# Patient Record
Sex: Male | Born: 1970 | Race: White | Hispanic: No | Marital: Married | Smoking: Never smoker
Health system: Southern US, Community
[De-identification: ages and names within clinical notes are randomized; demographics above are authoritative.]

---

## 2003-09-23 ENCOUNTER — Ambulatory Visit (HOSPITAL_BASED_OUTPATIENT_CLINIC_OR_DEPARTMENT_OTHER): Admission: RE | Admit: 2003-09-23 | Discharge: 2003-09-23 | Payer: Self-pay | Admitting: Urology

## 2008-05-17 ENCOUNTER — Encounter: Admission: RE | Admit: 2008-05-17 | Discharge: 2008-05-17 | Payer: Self-pay | Admitting: Internal Medicine

## 2010-04-02 ENCOUNTER — Encounter: Payer: Self-pay | Admitting: Sports Medicine

## 2010-07-28 NOTE — Op Note (Signed)
Brandon Mitchell, Brandon Mitchell                             ACCOUNT NO.:  [id number]   MEDICAL RECORD NO.:  [id number]                   PATIENT TYPE:  AMB   LOCATION:  [facility]                                 FACILITY:  [facility]   PHYSICIAN:  Excell Seltzer. Annabell Howells, M.D.                 DATE OF BIRTH:  28-Sep-1970   DATE OF PROCEDURE:  09/23/2003  DATE OF DISCHARGE:                                 OPERATIVE REPORT   PREOPERATIVE DIAGNOSIS:  Desires sterilization.   POSTOPERATIVE DIAGNOSIS:  Desires sterilization.   PROCEDURE PERFORMED:  Bilateral vasectomy.   ATTENDING SURGEON:  Dr. Bjorn Pippin.   RESIDENT SURGEON:  Dr. Thyra Breed   ANESTHESIA:  LMA.   COMPLICATIONS:  None.   INDICATIONS FOR PROCEDURE:  Mr. Doering is a 40 year old white male, who  desires sterilization.  He has 2 children, and his wife has an intolerance  to various birth control methods.  He has a history of a left-sided  orchiopexy.  He desires vasectomy and has been counseled on the risks,  benefits, and alternatives of this procedure.   PROCEDURE IN DETAIL:  Following identification by his arm bracelet, the  patient was brought to the operating room and placed in the supine position.  Here, he underwent successful LMA anesthesia.  His perineum and genitalia  were then prepped with Betadine and draped in the usual sterile fashion.  The left testicle was somewhat high-riding within the left hemi-scrotum  given his history of a left-sided orchiopexy.  We began with a no scalpel  technique near the midline raphe to isolate the left vas deferens.  However,  the vas could not be delivered through this puncture site.  Therefore, we  made a more medial incision lower in the scrotum where the ring clamp was  used to isolate the left vas deferens.  This was then further delivered from  the incision and cleaned using both hemostats and ring clamps and towel  clip.  Once sufficient length of the vas was isolated, a hemostat was placed  on either  end.  Metzenbaum scissors were used to dissect the intervening  segment which was sent for pathologic analysis.  A 2-0 Vicryl suture was  then used to doubly ligate the proximal and distal ends which were then  released.  There was some minimal bleeding of the dartos tissue which was  controlled with Bovie electrocautery.  Through this puncture site, we then  easily found the right-sided vas deferens with the ring clamp and delivered  it from the opening.  An identical procedure was performed to that on the  left, and both the proximal and distal ends of the vas deferens were doubly  tied with 2-0 Vicryl suture.  Following this, the opening through which we  delivered both vas was somewhat large.  Therefore, 2-0 chromic suture was  used to reapproximate the dartos muscle beneath.  This  same suture was then  run in a subcuticular fashion to close the skin.  Collodion was applied to  all puncture sites.  The patient tolerated the procedure well, and there  were no complications.  Please note that Dr. Bjorn Pippin was present and  participated in the entire procedure as he was the responsible surgeon.   DISPOSITION:  After awaking from general anesthetic, the patient was  transferred to the postanesthesia care unit for further postoperative  management prior to discharge to home.     Thyra Breed, MD                            Excell Seltzer. Annabell Howells, M.D.    EG/MEDQ  D:  09/23/2003  T:  09/23/2003  Job:  [id number]

## 2012-05-19 ENCOUNTER — Ambulatory Visit: Payer: Self-pay

## 2012-05-19 ENCOUNTER — Other Ambulatory Visit: Payer: Self-pay | Admitting: Occupational Medicine

## 2012-05-19 DIAGNOSIS — M25532 Pain in left wrist: Secondary | ICD-10-CM

## 2017-03-28 ENCOUNTER — Ambulatory Visit
Admission: RE | Admit: 2017-03-28 | Discharge: 2017-03-28 | Disposition: A | Discharge status: Home / Self Care | Payer: Worker's Compensation | Source: Ambulatory Visit | Attending: Nurse Practitioner | Admitting: Nurse Practitioner

## 2017-03-28 ENCOUNTER — Other Ambulatory Visit: Payer: Self-pay | Admitting: Nurse Practitioner

## 2017-03-28 DIAGNOSIS — M79641 Pain in right hand: Secondary | ICD-10-CM

## 2018-07-27 IMAGING — CR DG HAND COMPLETE 3+V*R*
4 series · 4 of 4 positions shown · non-contrast
Comparison: None

CLINICAL DATA: Heavy drill torqued in right hand yesterday

EXAM:
RIGHT HAND - COMPLETE 3+ VIEW

[x hand pa right]
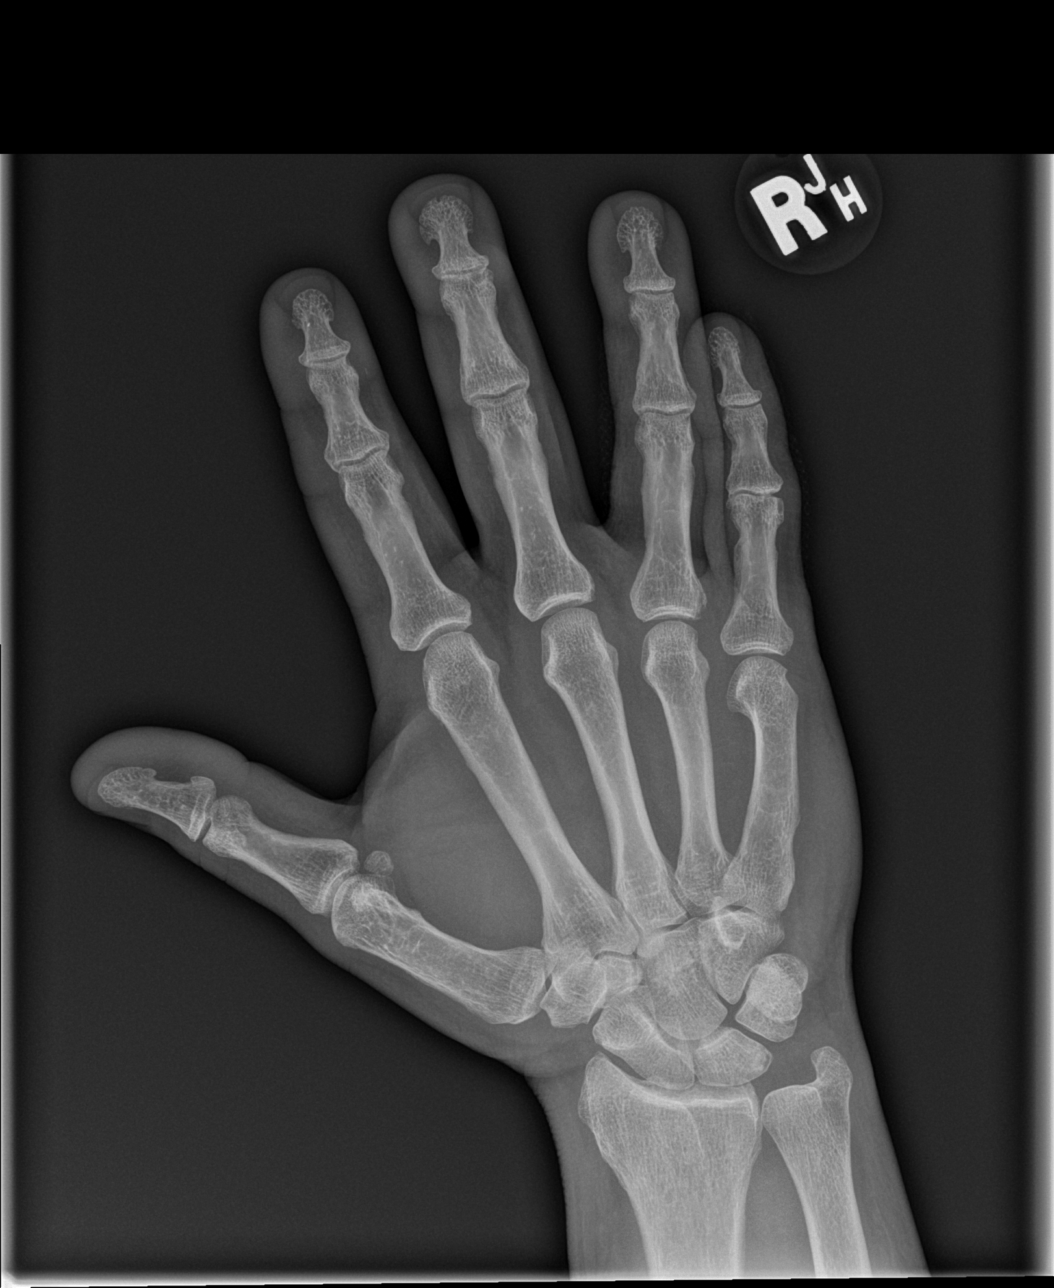

[x hand obl right]
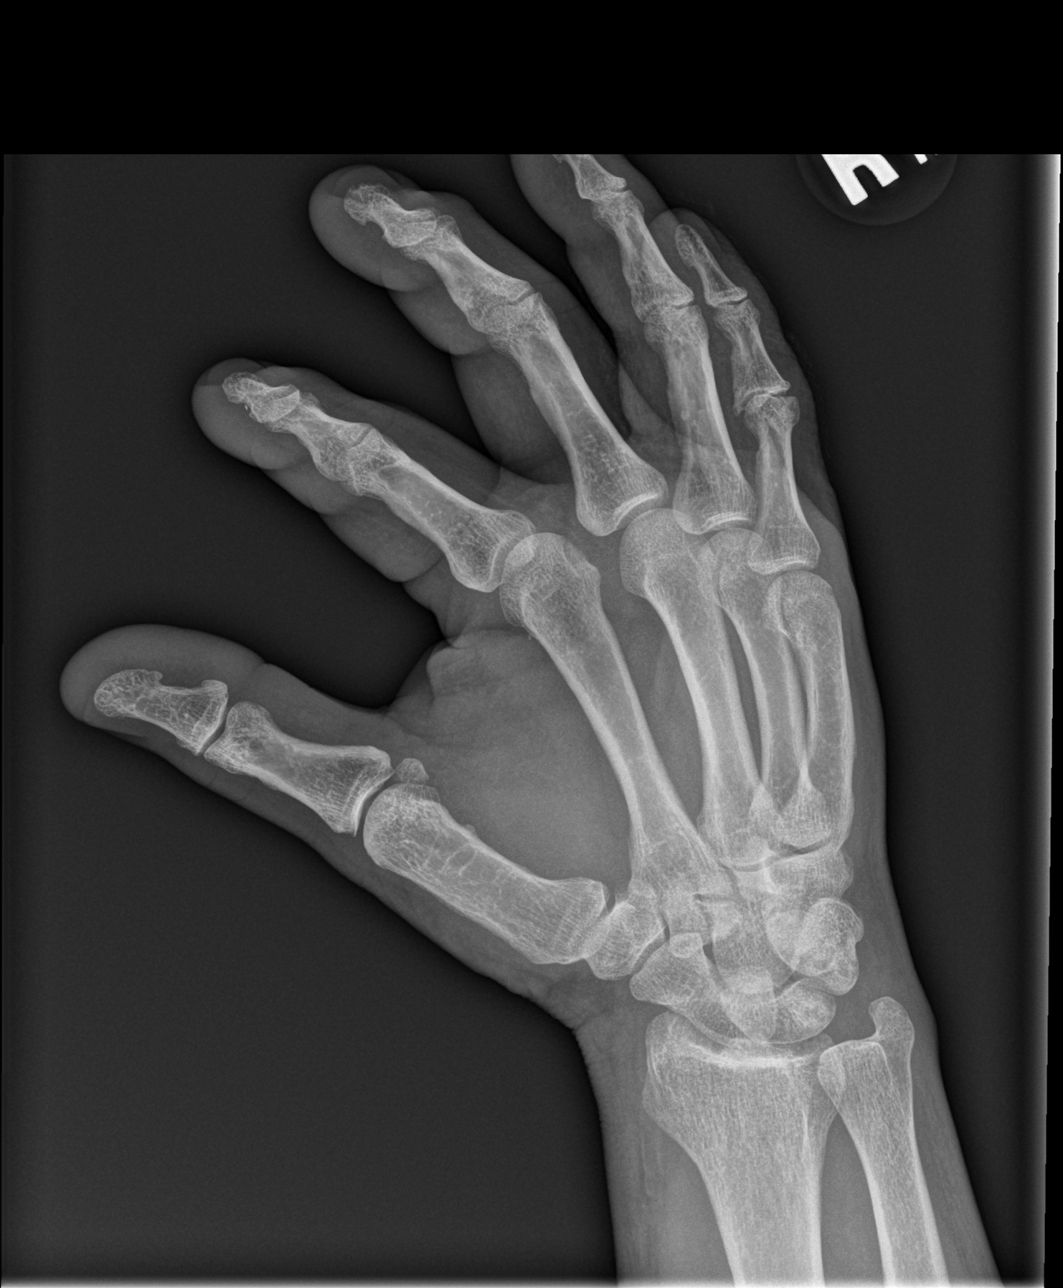

[x hand lat right (1 of 2)]
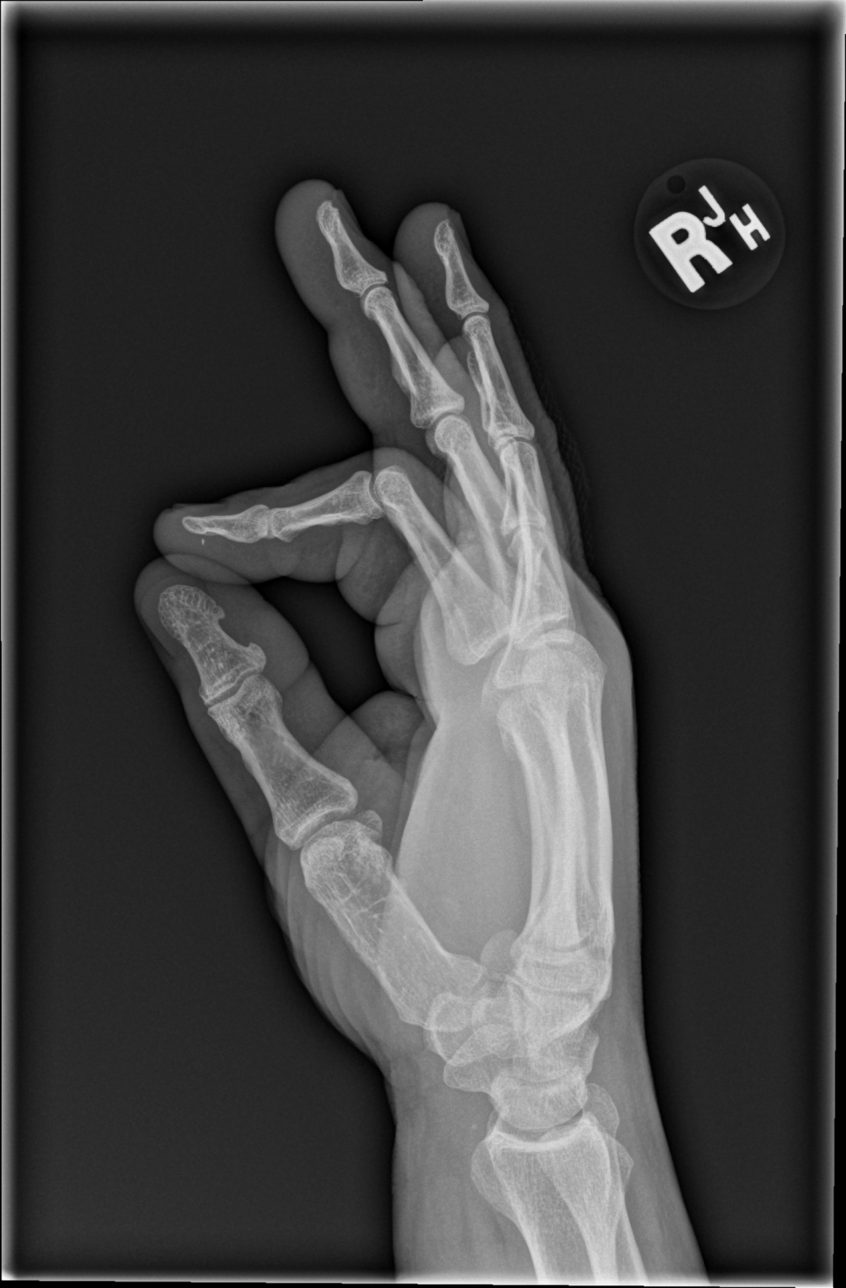

[x hand lat right (2 of 2)]
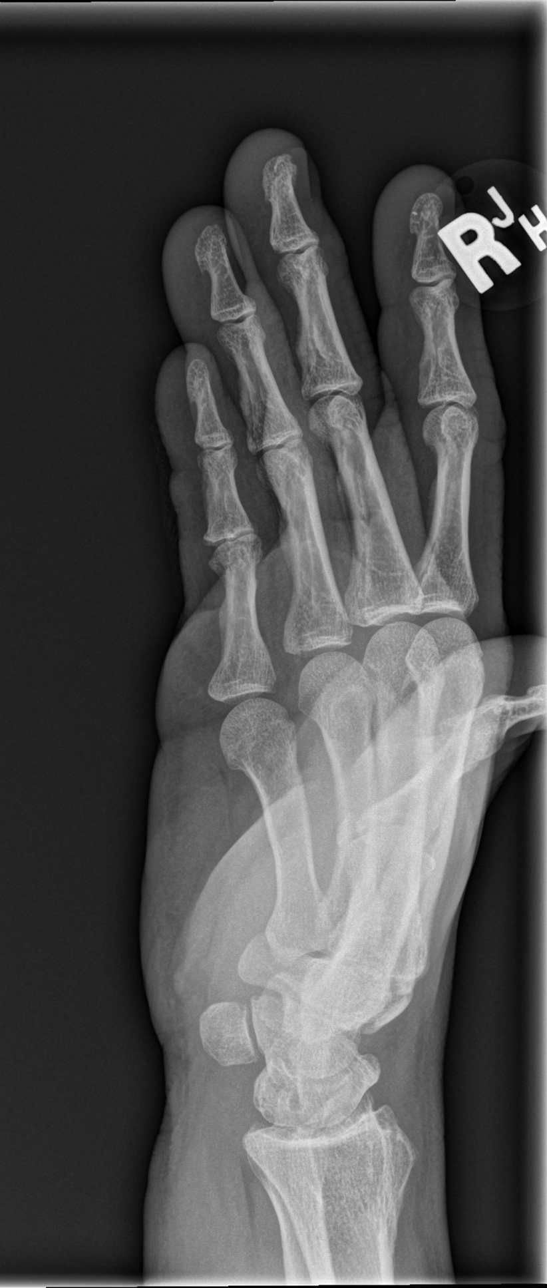

[4 of 4 positions shown; findings below may reference images not displayed]

FINDINGS: There is an old healed fracture deformity involving the fifth
metacarpal bone. No acute fractures or dislocations identified. Soft
tissues are unremarkable.
IMPRESSION: 1. No acute bone abnormality.
2. Healed fracture deformity of the fifth metacarpal bone.

## 2020-01-28 ENCOUNTER — Other Ambulatory Visit: Payer: Self-pay | Admitting: Nurse Practitioner

## 2020-01-28 ENCOUNTER — Ambulatory Visit
Admission: RE | Admit: 2020-01-28 | Discharge: 2020-01-28 | Disposition: A | Discharge status: Home / Self Care | Payer: Worker's Compensation | Source: Ambulatory Visit | Attending: Nurse Practitioner | Admitting: Nurse Practitioner

## 2020-01-28 DIAGNOSIS — T148XXA Other injury of unspecified body region, initial encounter: Secondary | ICD-10-CM

## 2020-01-28 DIAGNOSIS — R609 Edema, unspecified: Secondary | ICD-10-CM

## 2020-06-10 ENCOUNTER — Ambulatory Visit
Admission: EM | Admit: 2020-06-10 | Discharge: 2020-06-10 | Disposition: A | Discharge status: Home / Self Care | Payer: 59

## 2020-06-10 ENCOUNTER — Other Ambulatory Visit: Payer: Self-pay

## 2020-06-10 DIAGNOSIS — R519 Headache, unspecified: Secondary | ICD-10-CM | POA: Diagnosis not present

## 2020-06-10 DIAGNOSIS — S0181XA Laceration without foreign body of other part of head, initial encounter: Secondary | ICD-10-CM

## 2020-06-10 MED ORDER — NAPROXEN 500 MG PO TABS: 500.0000 mg | ORAL_TABLET | Freq: Two times a day (BID) | ORAL | 0 refills | Status: DC

## 2020-06-10 NOTE — ED Provider Notes (Signed)
[facility]   MRN: [id number] DOB: Oct 23, 1970  Subjective:   Brandon Mitchell is a 49 y.o. male presenting for suffering a right lip laceration.  Patient was at work, does Lobbyist work.  He was using a drill and lost control of it and making impact against his mouth on the right side.  He suffered a laceration as a result and also hurt his inside of his lip against his teeth.  The drill bit actually did not make impact against him.  Denies head injury, loss consciousness.  No current facility-administered medications for this encounter.  Current Outpatient Medications:  .  metFORMIN (GLUCOPHAGE) 1000 MG tablet, metformin 1,000 mg tablet  TAKE 1 TABLET BY MOUTH TWICE DAILY, Disp: , Rfl:  .  methylPREDNISolone (MEDROL DOSEPAK) 4 MG TBPK tablet, See admin instructions., Disp: , Rfl:  .  Semaglutide (RYBELSUS) 14 MG TABS, Rybelsus 14 mg tablet  TAKE 1 TABLET BY MOUTH ONCE DAILY, Disp: , Rfl:    No Known Allergies  History reviewed. No pertinent past medical history.   History reviewed. No pertinent surgical history.  Family History  Family history unknown: Yes    Social History   Tobacco Use  . Smoking status: Never Smoker  . Smokeless tobacco: Never Used    ROS   Objective:   Vitals: BP 136/86 (BP Location: Left Arm)   Pulse 89   Temp 98.9 F (37.2 C) (Temporal)   Resp 18   SpO2 98%   Physical Exam Constitutional:      General: He is not in acute distress.    Appearance: Normal appearance. He is well-developed and normal weight. He is not ill-appearing, toxic-appearing or diaphoretic.  HENT:     Head: Normocephalic and atraumatic.     Right Ear: External ear normal.     Left Ear: External ear normal.     Nose: Nose normal.     Mouth/Throat:     Pharynx: Oropharynx is clear.   Eyes:     General: No scleral icterus.       Right eye: No discharge.        Left eye: No discharge.     Extraocular Movements: Extraocular movements intact.     Pupils:  Pupils are equal, round, and reactive to light.  Cardiovascular:     Rate and Rhythm: Normal rate.  Pulmonary:     Effort: Pulmonary effort is normal.  Musculoskeletal:     Cervical back: Normal range of motion.  Neurological:     Mental Status: He is alert and oriented to person, place, and time.  Psychiatric:        Mood and Affect: Mood normal.        Behavior: Behavior normal.        Thought Content: Thought content normal.        Judgment: Judgment normal.     PROCEDURE NOTE: laceration repair Verbal consent obtained from patient.  Local anesthesia with 2cc Lidocaine 1% with epinephrine.  Wound explored for tendon, ligament damage. Wound scrubbed with soap and water and rinsed. Wound closed with #4 6-0 Prolene (simple interrupted) sutures.  Wound cleansed and dressed.   Assessment and Plan :   PDMP not reviewed this encounter.  1. Facial pain   2. Facial laceration, initial encounter     Laceration repaired successfully. Wound care reviewed. Recommended Tylenol and/or ibuprofen for pain control. Return-to-clinic precautions discussed, patient verbalized understanding. Otherwise, follow up in 5-7 days for suture removal.  Wallis Bamberg, [place] 06/10/20 1724

## 2020-06-10 NOTE — ED Triage Notes (Signed)
Pt present lip laceration from a drill today. The laceration is on located on the bottom of his lip

## 2020-06-10 NOTE — Discharge Instructions (Signed)

## 2021-05-28 IMAGING — CR DG ANKLE COMPLETE 3+V*R*
3 series · 3 of 3 positions shown · non-contrast
Comparison: None.

CLINICAL DATA: Swelling and bruising. Slipped off step 01/27/2020
feeling a pop. Question fracture.

EXAM:
RIGHT ANKLE - COMPLETE 3+ VIEW

[x ankle ap right]
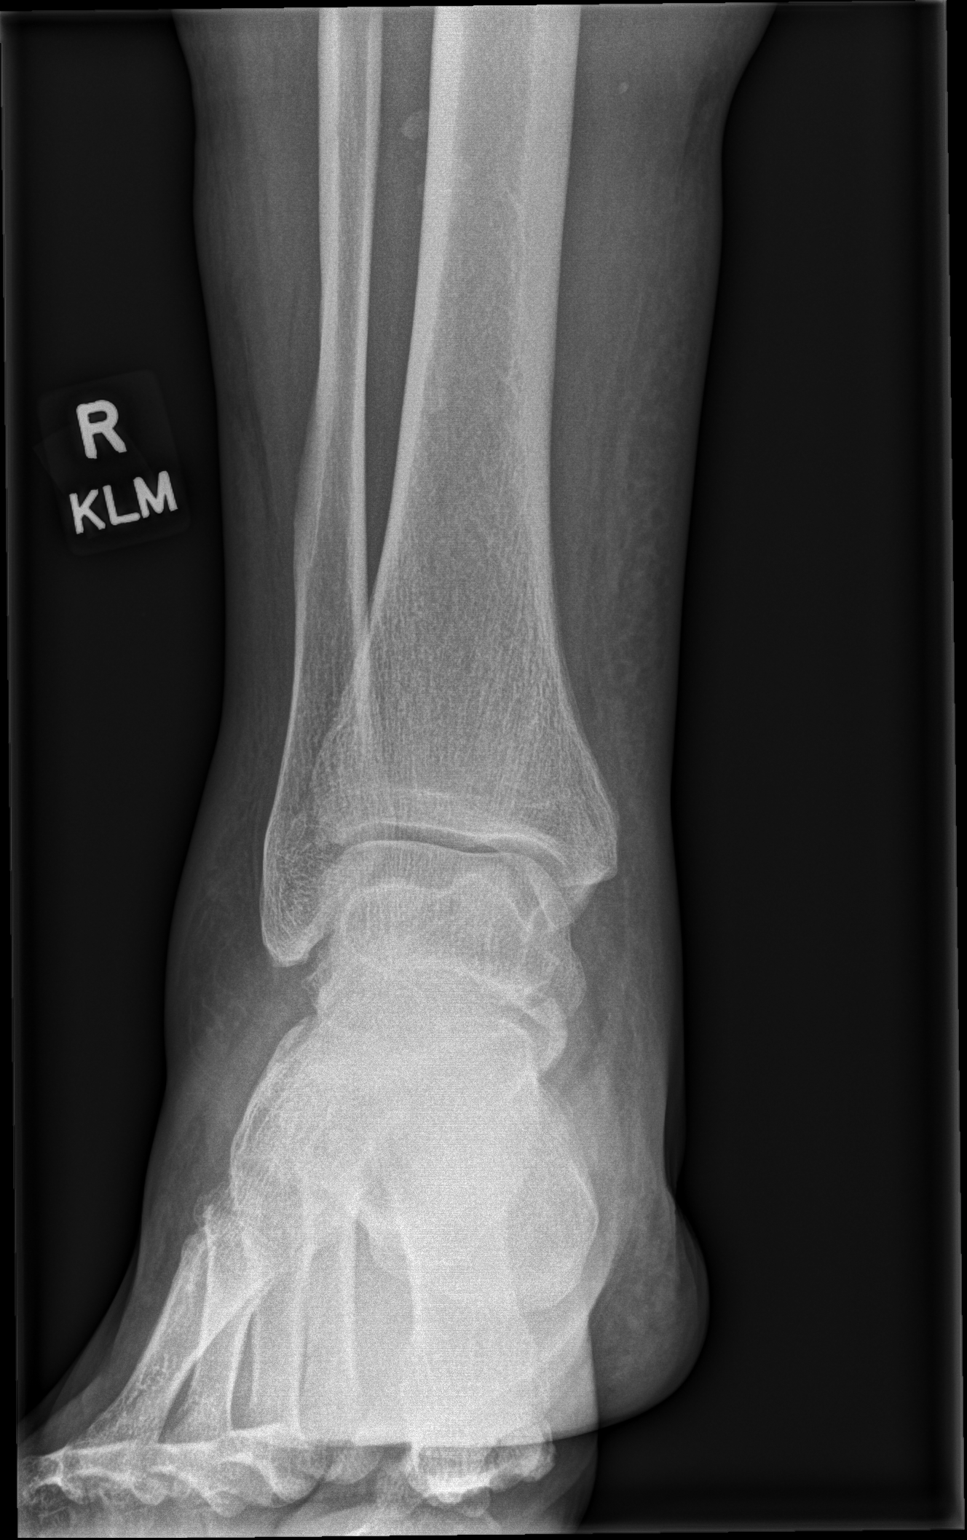

[x ankle obl right]
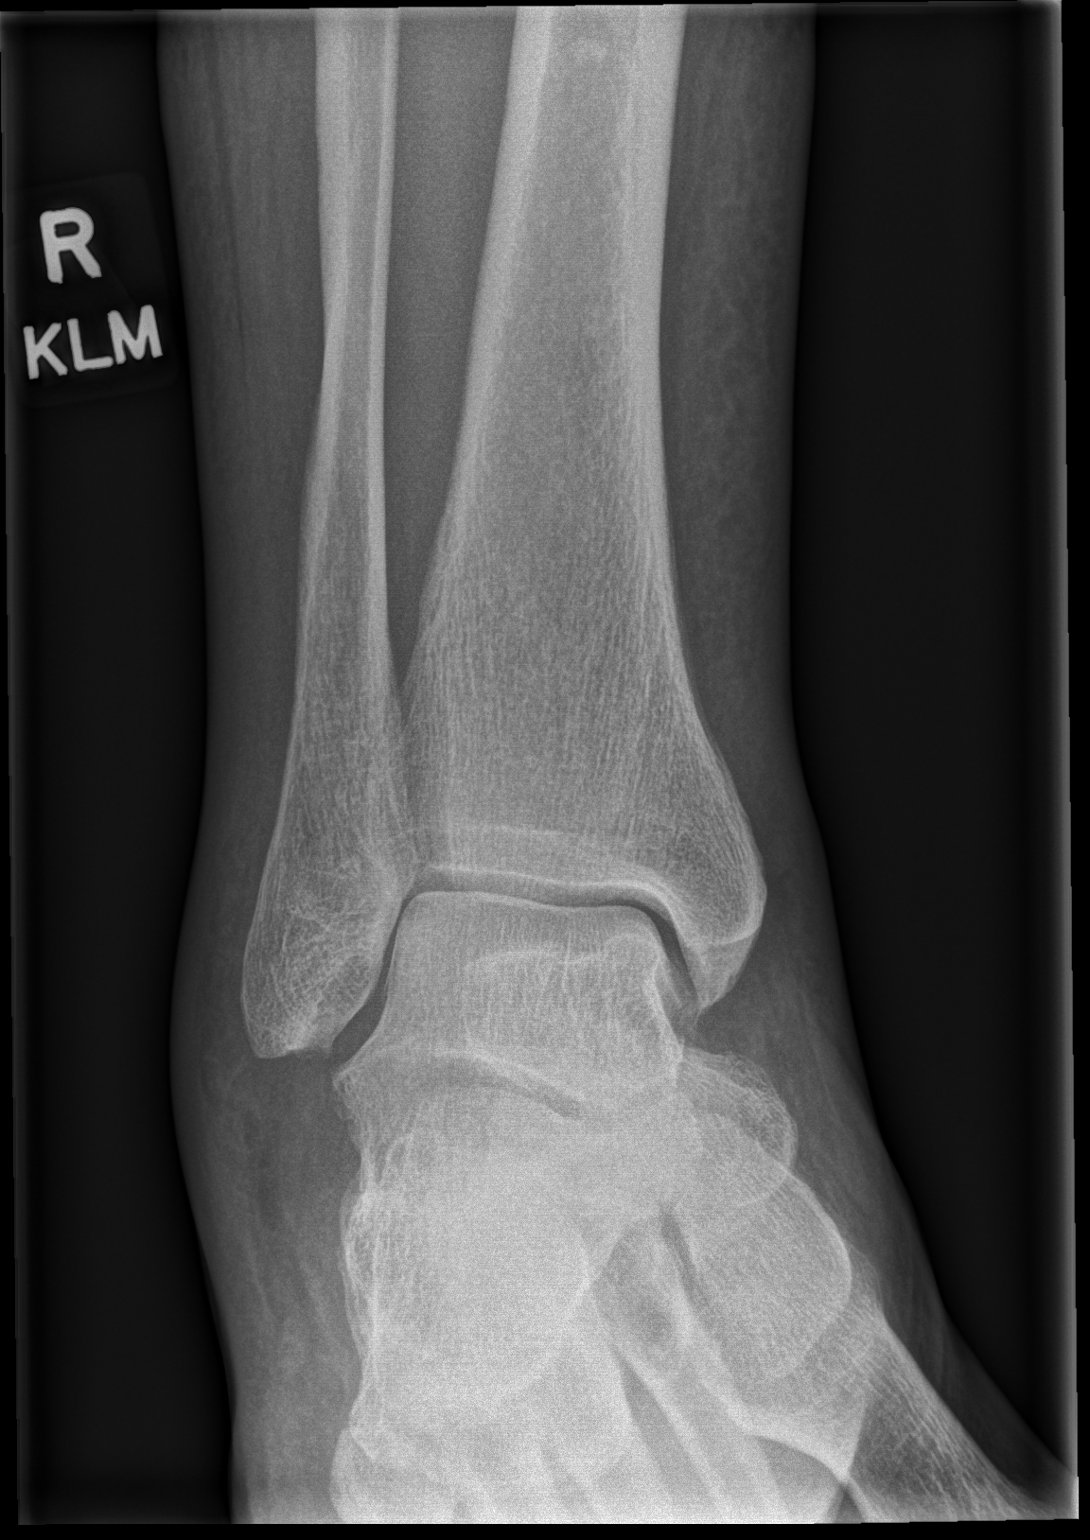

[x ankle lat right]
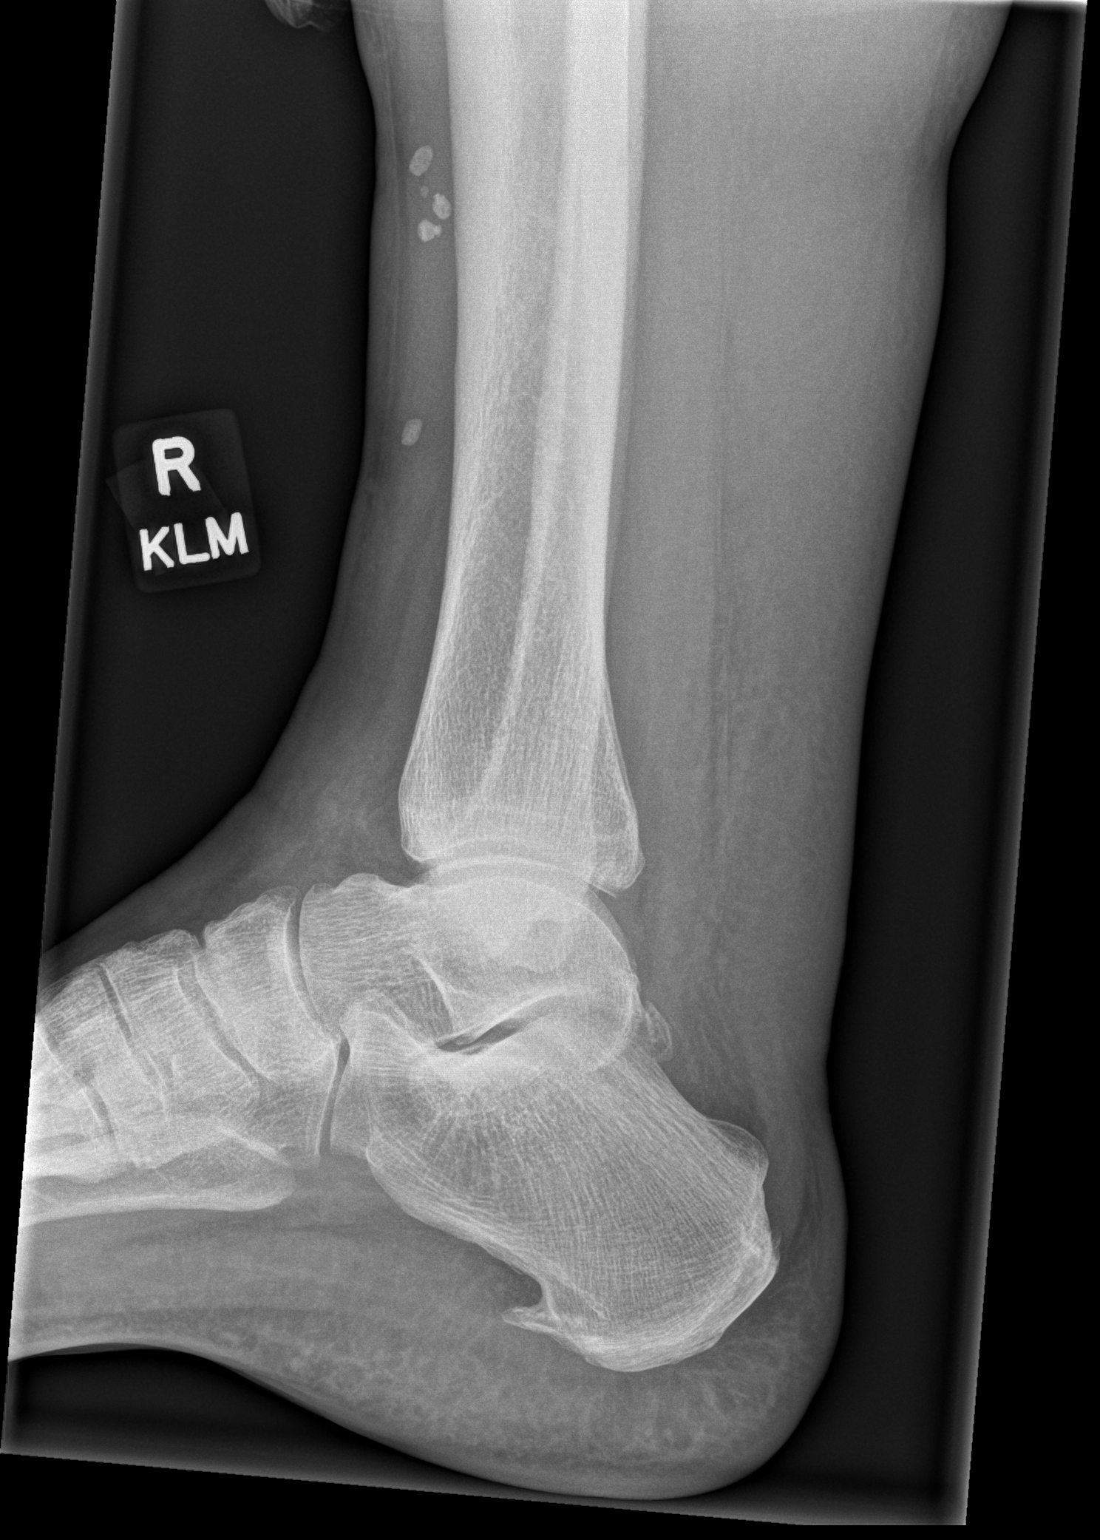

[3 of 3 positions shown; findings below may reference images not displayed]

FINDINGS: There is no evidence of fracture, dislocation, or joint effusion.
Base of the fifth metatarsal is intact. Ankle mortise is preserved.
There is an os trigonum. Moderate plantar calcaneal spur. Minor
degenerative spurring at the talonavicular joint. There is no joint
effusion. Generalized soft tissue edema.
IMPRESSION: 1. Generalized soft tissue edema. No fracture or dislocation.
2. Moderate plantar calcaneal spur.

## 2021-12-02 ENCOUNTER — Ambulatory Visit
Admission: EM | Admit: 2021-12-02 | Discharge: 2021-12-02 | Disposition: A | Discharge status: Home / Self Care | Payer: 59 | Attending: Physician Assistant | Admitting: Physician Assistant

## 2021-12-02 DIAGNOSIS — J0100 Acute maxillary sinusitis, unspecified: Secondary | ICD-10-CM

## 2021-12-02 MED ORDER — AMOXICILLIN-POT CLAVULANATE 875-125 MG PO TABS: 1.0000 | ORAL_TABLET | Freq: Two times a day (BID) | ORAL | 0 refills | Status: DC

## 2021-12-02 NOTE — ED Provider Notes (Signed)
[facility]    CSN: [id number] Arrival date & time: 12/02/21  0944      History   Chief Complaint Chief Complaint  Patient presents with   Facial Pain    HPI Brandon Mitchell is a 51 y.o. male.   Patient here today for evaluation of right sided facial pain and swelling, maxillary sinus pressure that started about 6 days ago. He has had prescription for prednisone which has not been helpful. He denies any fever. He reports some pain developing to right ear. He denies any vomiting or diarrhea.   The history is provided by the patient.    History reviewed. No pertinent past medical history.  There are no problems to display for this patient.   History reviewed. No pertinent surgical history.     Home Medications    Prior to Admission medications   Medication Sig Start Date End Date Taking? Authorizing Provider  amoxicillin-clavulanate (AUGMENTIN) 875-125 MG tablet Take 1 tablet by mouth every 12 (twelve) hours. 12/02/21  Yes Francene Finders, PA-C  metFORMIN (GLUCOPHAGE) 1000 MG tablet metformin 1,000 mg tablet  TAKE 1 TABLET BY MOUTH TWICE DAILY    [provider]  methylPREDNISolone (MEDROL DOSEPAK) 4 MG TBPK tablet See admin instructions. 05/16/20   [provider]  naproxen (NAPROSYN) 500 MG tablet Take 1 tablet (500 mg total) by mouth 2 (two) times daily with a meal. 06/10/20   Jaynee Eagles, PA-C  Semaglutide (RYBELSUS) 14 MG TABS Rybelsus 14 mg tablet  TAKE 1 TABLET BY MOUTH ONCE DAILY    [provider]    Family History Family History  Family history unknown: Yes    Social History Social History   Tobacco Use   Smoking status: Never   Smokeless tobacco: Never     Allergies   Patient has no known allergies.   Review of Systems Review of Systems  Constitutional:  Negative for fever.  HENT:  Positive for congestion, ear pain, facial swelling and sore throat (scratchy throat in morning).   Eyes:  Negative for discharge  and redness.  Respiratory:  Negative for cough and shortness of breath.   Gastrointestinal:  Negative for abdominal pain, nausea and vomiting.     Physical Exam Triage Vital Signs ED Triage Vitals [12/02/21 1003]  Enc Vitals Group     BP (!) 142/87     Pulse Rate 95     Resp 15     Temp 98.2 F (36.8 C)     Temp src      SpO2 95 %     Weight      Height      Head Circumference      Peak Flow      Pain Score      Pain Loc      Pain Edu?      Excl. in [place]?    No data found.  Updated Vital Signs BP (!) 142/87   Pulse 95   Temp 98.2 F (36.8 C)   Resp 15   SpO2 95%   Physical Exam Vitals and nursing note reviewed.  Constitutional:      General: He is not in acute distress.    Appearance: He is well-developed. He is not ill-appearing.  HENT:     Head: Normocephalic and atraumatic.     Comments: Swelling noted to right maxillary sinus area    Nose: Congestion present.     Mouth/Throat:     Mouth: Mucous  membranes are moist.     Pharynx: Posterior oropharyngeal erythema present. No oropharyngeal exudate.  Eyes:     Conjunctiva/sclera: Conjunctivae normal.  Cardiovascular:     Rate and Rhythm: Normal rate.  Pulmonary:     Effort: Pulmonary effort is normal. No respiratory distress.  Skin:    General: Skin is warm and dry.  Neurological:     Mental Status: He is alert.  Psychiatric:        Mood and Affect: Mood normal.        Behavior: Behavior normal.      UC Treatments / Results  Labs (all labs ordered are listed, but only abnormal results are displayed) Labs Reviewed - No data to display  EKG   Radiology No results found.  Procedures Procedures (including critical care time)  Medications Ordered in UC Medications - No data to display  Initial Impression / Assessment and Plan / UC Course  I have reviewed the triage vital signs and the nursing notes.  Pertinent labs & imaging results that were available during my care of the patient were  reviewed by me and considered in my medical decision making (see chart for details).    Augmentin prescribed to cover sinusitis vs dental abscess. Encouraged follow up if no gradual improvement or with any further concerns.   Final Clinical Impressions(s) / UC Diagnoses   Final diagnoses:  Acute maxillary sinusitis, recurrence not specified   Discharge Instructions   None    ED Prescriptions     Medication Sig Dispense Auth. Provider   amoxicillin-clavulanate (AUGMENTIN) 875-125 MG tablet Take 1 tablet by mouth every 12 (twelve) hours. 14 tablet Tomi Bamberger, PA-C      PDMP not reviewed this encounter.   Tomi Bamberger, PA-C 12/02/21 1115

## 2021-12-02 NOTE — ED Triage Notes (Signed)
Pt presents to uc with co of right facial pain and swelling since Monday. Pt reports he was seen by zoom uc visit and was rx prednisone. Pt finished prednisone yesterday. Pt reports pain and swelling has continued. Pt reports Advil last night. Pain under partial, otalgia, and sinus pressure.

## 2022-01-07 ENCOUNTER — Other Ambulatory Visit: Payer: Self-pay

## 2022-01-07 ENCOUNTER — Ambulatory Visit
Admission: EM | Admit: 2022-01-07 | Discharge: 2022-01-07 | Disposition: A | Discharge status: Home / Self Care | Payer: 59 | Attending: Emergency Medicine | Admitting: Emergency Medicine

## 2022-01-07 ENCOUNTER — Encounter: Payer: Self-pay | Admitting: Emergency Medicine

## 2022-01-07 DIAGNOSIS — Z1152 Encounter for screening for COVID-19: Secondary | ICD-10-CM | POA: Diagnosis not present

## 2022-01-07 DIAGNOSIS — R0602 Shortness of breath: Secondary | ICD-10-CM | POA: Diagnosis not present

## 2022-01-07 DIAGNOSIS — R062 Wheezing: Secondary | ICD-10-CM | POA: Diagnosis not present

## 2022-01-07 DIAGNOSIS — B349 Viral infection, unspecified: Secondary | ICD-10-CM | POA: Diagnosis not present

## 2022-01-07 MED ORDER — PROMETHAZINE-DM 6.25-15 MG/5ML PO SYRP: 5.0000 mL | ORAL_SOLUTION | Freq: Four times a day (QID) | ORAL | 0 refills | Status: AC | PRN

## 2022-01-07 MED ORDER — CETIRIZINE HCL 10 MG PO TABS: 10.0000 mg | ORAL_TABLET | Freq: Every day | ORAL | 1 refills | Status: AC

## 2022-01-07 MED ORDER — METHYLPREDNISOLONE 8 MG PO TABS: 24.0000 mg | ORAL_TABLET | Freq: Every day | ORAL | 0 refills | Status: AC

## 2022-01-07 MED ORDER — ALBUTEROL SULFATE (2.5 MG/3ML) 0.083% IN NEBU
2.5000 mg | INHALATION_SOLUTION | Freq: Once | RESPIRATORY_TRACT | Status: AC
  Administered 2022-01-07: 2.5 mg via RESPIRATORY_TRACT

## 2022-01-07 MED ORDER — ALBUTEROL SULFATE HFA 108 (90 BASE) MCG/ACT IN AERS: 2.0000 | INHALATION_SPRAY | Freq: Four times a day (QID) | RESPIRATORY_TRACT | 0 refills | Status: AC | PRN

## 2022-01-07 MED ORDER — IPRATROPIUM BROMIDE 0.06 % NA SOLN: 2.0000 | Freq: Three times a day (TID) | NASAL | 1 refills | Status: AC

## 2022-01-07 MED ORDER — AEROCHAMBER PLUS FLO-VU LARGE MISC: 1.0000 | Freq: Once | 0 refills | Status: AC

## 2022-01-07 NOTE — ED Triage Notes (Signed)
Pt sts cough and pain with cough along with returned sinus pressure; pt sts treated for sinus infection 1 month ago

## 2022-01-07 NOTE — ED Provider Notes (Signed)
[facility]    CSN: [id number] Arrival date & time: 01/07/22  0906    HISTORY   Chief Complaint  Patient presents with   Cough   HPI Brandon Mitchell is a pleasant, 51 y.o. male who presents to urgent care today. Pt complains of persistent, nonproductive cough and pain in central chest when coughing as well as return of sinus pressure after completing a course of amoxicillin for bacterial sinusitis prescribed 12/02/2021.  Patient states that he has not tried medications to alleviate his current symptoms.  Patient reports a history of allergies in the spring and fall, states not currently taking anything for allergies.  States cough is worsened over the past several days but has been persistent for a few weeks.  Patient denies known sick contacts, nausea, vomiting, diarrhea.  Patient endorses mild headache, body ache and increased fatigue.  The history is provided by the patient.   History reviewed. No pertinent past medical history. There are no problems to display for this patient.  History reviewed. No pertinent surgical history.  Home Medications    Prior to Admission medications   Medication Sig Start Date End Date Taking? Authorizing Provider  metFORMIN (GLUCOPHAGE) 1000 MG tablet metformin 1,000 mg tablet  TAKE 1 TABLET BY MOUTH TWICE DAILY    [provider]  Semaglutide (RYBELSUS) 14 MG TABS Rybelsus 14 mg tablet  TAKE 1 TABLET BY MOUTH ONCE DAILY    [provider]    Family History Family History  Family history unknown: Yes   Social History Social History   Tobacco Use   Smoking status: Never   Smokeless tobacco: Never   Allergies   Patient has no known allergies.  Review of Systems Review of Systems Pertinent findings revealed after performing a 14 point review of systems has been noted in the history of present illness.  Physical Exam Triage Vital Signs ED Triage Vitals  Enc Vitals Group     BP 01/06/21 0827 (!) 147/82      Pulse Rate 01/06/21 0827 72     Resp 01/06/21 0827 18     Temp 01/06/21 0827 98.3 F (36.8 C)     Temp Source 01/06/21 0827 Oral     SpO2 01/06/21 0827 98 %     Weight --      Height --      Head Circumference --      Peak Flow --      Pain Score 01/06/21 0826 5     Pain Loc --      Pain Edu? --      Excl. in GC? --   No data found.  Updated Vital Signs BP 137/86 (BP Location: Left Arm)   Pulse 94   Temp 99.3 F (37.4 C) (Oral)   Resp 18   SpO2 96%   Physical Exam Vitals and nursing note reviewed.  Constitutional:      General: He is not in acute distress.    Appearance: Normal appearance. He is not ill-appearing.  HENT:     Head: Normocephalic and atraumatic.     Salivary Glands: Right salivary gland is not diffusely enlarged or tender. Left salivary gland is not diffusely enlarged or tender.     Right Ear: Ear canal and external ear normal. No drainage. A middle ear effusion is present. There is no impacted cerumen. Tympanic membrane is bulging. Tympanic membrane is not injected or erythematous.     Left Ear: Ear canal and external ear  normal. No drainage. A middle ear effusion is present. There is no impacted cerumen. Tympanic membrane is bulging. Tympanic membrane is not injected or erythematous.     Ears:     Comments: Bilateral EACs normal, both TMs bulging with clear fluid    Nose: Rhinorrhea present. No nasal deformity, septal deviation, signs of injury, nasal tenderness, mucosal edema or congestion. Rhinorrhea is clear.     Right Nostril: Occlusion present. No foreign body, epistaxis or septal hematoma.     Left Nostril: Occlusion present. No foreign body, epistaxis or septal hematoma.     Right Turbinates: Enlarged, swollen and pale.     Left Turbinates: Enlarged, swollen and pale.     Right Sinus: No maxillary sinus tenderness or frontal sinus tenderness.     Left Sinus: No maxillary sinus tenderness or frontal sinus tenderness.     Mouth/Throat:     Lips:  Pink. No lesions.     Mouth: Mucous membranes are moist. No oral lesions.     Pharynx: Oropharynx is clear. Uvula midline. No posterior oropharyngeal erythema or uvula swelling.     Tonsils: No tonsillar exudate. 0 on the right. 0 on the left.     Comments: Postnasal drip Eyes:     General: Lids are normal.        Right eye: No discharge.        Left eye: No discharge.     Extraocular Movements: Extraocular movements intact.     Conjunctiva/sclera: Conjunctivae normal.     Right eye: Right conjunctiva is not injected.     Left eye: Left conjunctiva is not injected.  Neck:     Trachea: Trachea and phonation normal.  Cardiovascular:     Rate and Rhythm: Normal rate and regular rhythm.     Pulses: Normal pulses.     Heart sounds: Normal heart sounds. No murmur heard.    No friction rub. No gallop.  Pulmonary:     Effort: Pulmonary effort is normal. No accessory muscle usage, prolonged expiration or respiratory distress.     Breath sounds: No stridor, decreased air movement or transmitted upper airway sounds. Examination of the right-middle field reveals decreased breath sounds. Examination of the right-lower field reveals decreased breath sounds and wheezing. Examination of the left-lower field reveals decreased breath sounds. Decreased breath sounds and wheezing present. No rhonchi or rales.  Chest:     Chest wall: No tenderness.  Musculoskeletal:        General: Normal range of motion.     Cervical back: Normal range of motion and neck supple. Normal range of motion.  Lymphadenopathy:     Cervical: No cervical adenopathy.  Skin:    General: Skin is warm and dry.     Findings: No erythema or rash.  Neurological:     General: No focal deficit present.     Mental Status: He is alert and oriented to person, place, and time.  Psychiatric:        Mood and Affect: Mood normal.        Behavior: Behavior normal.     Visual Acuity Right Eye Distance:   Left Eye Distance:   Bilateral  Distance:    Right Eye Near:   Left Eye Near:    Bilateral Near:     UC Couse / Diagnostics / Procedures:     Radiology No results found.  Procedures Procedures (including critical care time) EKG  Pending results:  Labs Reviewed  SARS CORONAVIRUS 2 (  TAT 6-24 HRS)    Medications Ordered in UC: Medications  albuterol (PROVENTIL) (2.5 MG/3ML) 0.083% nebulizer solution 2.5 mg (2.5 mg Nebulization Given 01/07/22 1040)    UC Diagnoses / Final Clinical Impressions(s)   I have reviewed the triage vital signs and the nursing notes.  Pertinent labs & imaging results that were available during my care of the patient were reviewed by me and considered in my medical decision making (see chart for details).    Final diagnoses:  Viral illness   Patient reported improved work of breathing after nebulized albuterol treatment.  Patient provided prescription for albuterol and advised use 4 times daily for the next several days.  Patient provided with methylprednisolone to calm inflammation in lungs.  Allergy medications including Zyrtec and Atrovent nasal spray provided to calm upper respiratory allergies.  Patient provided with Promethazine DM to calm his nighttime cough for the next several nights.  Patient advised to continue using albuterol at least twice daily once acute phase has resolved.  Return precautions advised.  ED Prescriptions     Medication Sig Dispense Auth. Provider   cetirizine (ZYRTEC ALLERGY) 10 MG tablet Take 1 tablet (10 mg total) by mouth at bedtime. 90 tablet Lynden Oxford Scales, PA-C   ipratropium (ATROVENT) 0.06 % nasal spray Place 2 sprays into both nostrils 3 (three) times daily. As needed for nasal congestion, runny nose 15 mL Lynden Oxford Scales, PA-C   albuterol (VENTOLIN HFA) 108 (90 Base) MCG/ACT inhaler Inhale 2 puffs into the lungs every 6 (six) hours as needed for wheezing or shortness of breath (Cough). 18 g Lynden Oxford Scales, PA-C    Spacer/Aero-Holding Chambers (AEROCHAMBER PLUS FLO-VU LARGE) MISC 1 each by Other route once for 1 dose. 1 each Lynden Oxford Scales, PA-C   methylPREDNISolone (MEDROL) 8 MG tablet Take 3 tablets (24 mg total) by mouth daily for 3 days. 9 tablet Lynden Oxford Scales, PA-C   promethazine-dextromethorphan (PROMETHAZINE-DM) 6.25-15 MG/5ML syrup Take 5 mLs by mouth 4 (four) times daily as needed for cough. 60 mL Lynden Oxford Scales, PA-C      PDMP not reviewed this encounter.  Disposition Upon Discharge:  Condition: stable for discharge home Home: take medications as prescribed; routine discharge instructions as discussed; follow up as advised.  Patient presented with an acute illness with associated systemic symptoms and significant discomfort requiring urgent management. In my opinion, this is a condition that a prudent lay person (someone who possesses an average knowledge of health and medicine) may potentially expect to result in complications if not addressed urgently such as respiratory distress, impairment of bodily function or dysfunction of bodily organs.   Routine symptom specific, illness specific and/or disease specific instructions were discussed with the patient and/or caregiver at length.   As such, the patient has been evaluated and assessed, work-up was performed and treatment was provided in alignment with urgent care protocols and evidence based medicine.  Patient/parent/caregiver has been advised that the patient may require follow up for further testing and treatment if the symptoms continue in spite of treatment, as clinically indicated and appropriate.  If the patient was tested for COVID-19, Influenza and/or RSV, then the patient/parent/guardian was advised to isolate at home pending the results of his/her diagnostic coronavirus test and potentially longer if they're positive. I have also advised pt that if his/her COVID-19 test returns positive, it's recommended to  self-isolate for at least 10 days after symptoms first appeared AND until fever-free for 24 hours without fever reducer AND  other symptoms have improved or resolved. Discussed self-isolation recommendations as well as instructions for household member/close contacts as per the [facility] and  DHHS, and also gave patient the COVID packet with this information.  Patient/parent/caregiver has been advised to return to the [facility] or PCP in 3-5 days if no better; to PCP or the Emergency Department if new signs and symptoms develop, or if the current signs or symptoms continue to change or worsen for further workup, evaluation and treatment as clinically indicated and appropriate  The patient will follow up with their current PCP if and as advised. If the patient does not currently have a PCP we will assist them in obtaining one.   The patient may need specialty follow up if the symptoms continue, in spite of conservative treatment and management, for further workup, evaluation, consultation and treatment as clinically indicated and appropriate.  Patient/parent/caregiver verbalized understanding and agreement of plan as discussed.  All questions were addressed during visit.  Please see discharge instructions below for further details of plan.  Discharge Instructions:   Discharge Instructions      Please read below to learn more about the medications, dosages and frequencies that I recommend to help alleviate your symptoms and to get you feeling better soon:   Medrol Dosepak (methylprednisolone): This is a steroid that will significantly calm your upper and lower airways, please take the daily recommended quantity of tablets daily with your breakfast meal starting tomorrow morning until the prescription is complete.      Zyrtec (cetirizine): This is an excellent second-generation antihistamine that helps to reduce respiratory inflammatory response to environmental allergens.  In some patients, this medication  can cause daytime sleepiness so I recommend that you take 1 tablet daily at bedtime.     Atrovent (ipratropium): This is an excellent nasal decongestant spray that does not cause rebound congestion, please instill 2 sprays into each nare with each use.  Please use the spray up to 4 times daily as needed.  I have provided you with a prescription for this medication.      ProAir, Ventolin, Proventil (albuterol): This inhaled medication contains a short acting beta agonist bronchodilator.  This medication works on the smooth muscle that opens and constricts of your airways by relaxing the muscle.  The result of relaxation of the smooth muscle is increased air movement and improved work of breathing.  This is a short acting medication that can be used every 4-6 hours as needed for increased work of breathing, shortness of breath, wheezing and excessive coughing.  I have provided you with a prescription.   Promethazine DM: Promethazine is both a nasal decongestant and an antinausea medication that makes most patients feel fairly sleepy.  The DM is dextromethorphan, a cough suppressant found in many over-the-counter cough medications.  Please take 5 mL before bedtime to minimize your cough which will help you sleep better.  I have sent a prescription for this medication to your pharmacy.   Please follow-up within the next 5-7 days either with your primary care provider or urgent care if your symptoms do not resolve.  If you do not have a primary care provider, we will assist you in finding one.        Thank you for visiting urgent care today.  We appreciate the opportunity to participate in your care.         This office note has been dictated using Teaching laboratory technician.  Unfortunately, this method of dictation can sometimes  lead to typographical or grammatical errors.  I apologize for your inconvenience in advance if this occurs.  Please do not hesitate to reach out to me if clarification  is needed.      Theadora Rama Scales, [place] 01/08/22 [phone]

## 2022-01-07 NOTE — Discharge Instructions (Signed)
Please read below to learn more about the medications, dosages and frequencies that I recommend to help alleviate your symptoms and to get you feeling better soon:   Medrol Dosepak (methylprednisolone): This is a steroid that will significantly calm your upper and lower airways, please take the daily recommended quantity of tablets daily with your breakfast meal starting tomorrow morning until the prescription is complete.      Zyrtec (cetirizine): This is an excellent second-generation antihistamine that helps to reduce respiratory inflammatory response to environmental allergens.  In some patients, this medication can cause daytime sleepiness so I recommend that you take 1 tablet daily at bedtime.     Atrovent (ipratropium): This is an excellent nasal decongestant spray that does not cause rebound congestion, please instill 2 sprays into each nare with each use.  Please use the spray up to 4 times daily as needed.  I have provided you with a prescription for this medication.      ProAir, Ventolin, Proventil (albuterol): This inhaled medication contains a short acting beta agonist bronchodilator.  This medication works on the smooth muscle that opens and constricts of your airways by relaxing the muscle.  The result of relaxation of the smooth muscle is increased air movement and improved work of breathing.  This is a short acting medication that can be used every 4-6 hours as needed for increased work of breathing, shortness of breath, wheezing and excessive coughing.  I have provided you with a prescription.   Promethazine DM: Promethazine is both a nasal decongestant and an antinausea medication that makes most patients feel fairly sleepy.  The DM is dextromethorphan, a cough suppressant found in many over-the-counter cough medications.  Please take 5 mL before bedtime to minimize your cough which will help you sleep better.  I have sent a prescription for this medication to your pharmacy.   Please  follow-up within the next 5-7 days either with your primary care provider or urgent care if your symptoms do not resolve.  If you do not have a primary care provider, we will assist you in finding one.        Thank you for visiting urgent care today.  We appreciate the opportunity to participate in your care.

## 2022-01-08 LAB — SARS CORONAVIRUS 2 (TAT 6-24 HRS): SARS Coronavirus 2: NEGATIVE

## 2024-04-07 ENCOUNTER — Emergency Department (HOSPITAL_BASED_OUTPATIENT_CLINIC_OR_DEPARTMENT_OTHER)

## 2024-04-07 ENCOUNTER — Emergency Department (HOSPITAL_BASED_OUTPATIENT_CLINIC_OR_DEPARTMENT_OTHER): Admission: EM | Admit: 2024-04-07 | Discharge: 2024-04-07 | Disposition: A | Discharge status: Home / Self Care

## 2024-04-07 ENCOUNTER — Ambulatory Visit
Admission: RE | Admit: 2024-04-07 | Discharge: 2024-04-07 | Discharge status: Against Medical Advice | Source: Ambulatory Visit

## 2024-04-07 ENCOUNTER — Other Ambulatory Visit: Payer: Self-pay

## 2024-04-07 DIAGNOSIS — S81802A Unspecified open wound, left lower leg, initial encounter: Secondary | ICD-10-CM | POA: Insufficient documentation

## 2024-04-07 DIAGNOSIS — X58XXXA Exposure to other specified factors, initial encounter: Secondary | ICD-10-CM | POA: Diagnosis not present

## 2024-04-07 DIAGNOSIS — M7989 Other specified soft tissue disorders: Secondary | ICD-10-CM | POA: Insufficient documentation

## 2024-04-07 DIAGNOSIS — R21 Rash and other nonspecific skin eruption: Secondary | ICD-10-CM | POA: Diagnosis not present

## 2024-04-07 MED ORDER — PREDNISONE 10 MG (21) PO TBPK: ORAL_TABLET | Freq: Every day | ORAL | 0 refills | Status: AC
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
## 2024-04-07 NOTE — Discharge Instructions (Addendum)
 Take your steroids as prescribed.  Keep a close eye on your blood sugar.  You can use Tylenol and Motrin as needed for pain.  Follow-up with wound care, they should call you to make an appointment in the next few weeks.

## 2024-04-07 NOTE — ED Notes (Signed)
 DC paperwork given and verbally understood.

## 2024-04-22 ENCOUNTER — Encounter (HOSPITAL_BASED_OUTPATIENT_CLINIC_OR_DEPARTMENT_OTHER): Admitting: General Surgery
